# Patient Record
Sex: Female | Born: 1995 | Race: White | Hispanic: Yes | Marital: Single | Smoking: Never smoker
Health system: Southern US, Community
[De-identification: ages and names within clinical notes are randomized; demographics above are authoritative.]

## PROBLEM LIST (undated history)

## (undated) DIAGNOSIS — L309 Dermatitis, unspecified: Secondary | ICD-10-CM

## (undated) DIAGNOSIS — Z8759 Personal history of other complications of pregnancy, childbirth and the puerperium: Secondary | ICD-10-CM

## (undated) HISTORY — PX: WISDOM TOOTH EXTRACTION: SHX21

## (undated) HISTORY — PX: DENTAL EXAMINATION UNDER ANESTHESIA: SHX1447

## (undated) HISTORY — DX: Dermatitis, unspecified: L30.9

## (undated) HISTORY — DX: Personal history of other complications of pregnancy, childbirth and the puerperium: Z87.59

---
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
## 2015-05-28 ENCOUNTER — Ambulatory Visit (INDEPENDENT_AMBULATORY_CARE_PROVIDER_SITE_OTHER): Payer: BLUE CROSS/BLUE SHIELD | Admitting: Family Medicine

## 2015-05-28 ENCOUNTER — Encounter: Payer: Self-pay | Admitting: Family Medicine

## 2015-05-28 VITALS — BP 104/66 | HR 91 | Temp 97.8°F | Ht 61.0 in | Wt 141.2 lb

## 2015-05-28 DIAGNOSIS — Z0000 Encounter for general adult medical examination without abnormal findings: Secondary | ICD-10-CM

## 2015-05-28 NOTE — Progress Notes (Signed)
Pre visit review using our clinic review tool, if applicable. No additional management support is needed unless otherwise documented below in the visit note. 

## 2015-05-28 NOTE — Patient Instructions (Signed)
Good luck in school!

## 2015-05-28 NOTE — Assessment & Plan Note (Signed)
Discussed dangers of smoking, alcohol, and drug abuse.  Also discussed sexual activity, pregnancy risk, and STD risk.  Encouraged to get regular exercise.

## 2015-05-28 NOTE — Progress Notes (Signed)
Subjective:   Patient ID: Tracie Collier, female    DOB: 08-01-1996, 19 y.o.   MRN: [id number]  Tracie Collier is a pleasant 19 y.o. year old female who presents to clinic today with Well Child  on 05/28/2015  HPI:  G0.  Doing well in school at [employer].  Wants to go to law school. She is currently sexually active with one partner- met him at [employer]. Both were tested for STDs.  Also had lab work done at school, per pt.  She is using triamcinolone as needed for eczema.  Current Outpatient Prescriptions on File Prior to Visit  Medication Sig Dispense Refill  . TRIAMCINOLONE ACETONIDE, TOP, 0.05 % OINT Apply to skin twice daily as needed. 17 g 0   No current facility-administered medications on file prior to visit.    Allergies  Allergen Reactions  . Amoxicillin Hives  . Penicillins Hives    No past medical history on file.  No past surgical history on file.  No family history on file.  History   Social History  . Marital Status: Single    Spouse Name: N/A  . Number of Children: N/A  . Years of Education: N/A   Occupational History  . Not on file.   Social History Main Topics  . Smoking status: Never Smoker   . Smokeless tobacco: Never Used  . Alcohol Use: No  . Drug Use: No  . Sexual Activity: Not on file   Other Topics Concern  . Not on file   Social History Narrative  . No narrative on file   The PMH, PSH, Social History, Family History, Medications, and allergies have been reviewed in [facility], and have been updated if relevant.   Review of Systems  Constitutional: Negative.   HENT: Negative.   Eyes: Negative.   Respiratory: Negative.   Cardiovascular: Negative.   Gastrointestinal: Negative.   Endocrine: Negative.   Genitourinary: Negative.   Musculoskeletal: Negative.   Skin: Negative.   Allergic/Immunologic: Negative.   Neurological: Negative.   Psychiatric/Behavioral: Negative.   All other systems reviewed and are negative.      Objective:    BP 104/66 mmHg  Pulse 91  Temp(Src) 97.8 F (36.6 C) (Oral)  Ht '5\' 1"'  (1.549 m)  Wt 141 lb 4 oz (64.071 kg)  BMI 26.70 kg/m2  SpO2 97%  LMP 05/19/2015   Physical Exam    General:  Well-developed,well-nourished,in no acute distress; alert,appropriate and cooperative throughout examination Head:  normocephalic and atraumatic.   Eyes:  vision grossly intact, pupils equal, pupils round, and pupils reactive to light.   Ears:  R ear normal and L ear normal.   Nose:  no external deformity.   Mouth:  good dentition.   Neck:  No deformities, masses, or tenderness noted. Lungs:  Normal respiratory effort, chest expands symmetrically. Lungs are clear to auscultation, no crackles or wheezes. Heart:  Normal rate and regular rhythm. S1 and S2 normal without gallop, murmur, click, rub or other extra sounds. Abdomen:  Bowel sounds positive,abdomen soft and non-tender without masses, organomegaly or hernias noted. Msk:  No deformity or scoliosis noted of thoracic or lumbar spine.   Extremities:  No clubbing, cyanosis, edema, or deformity noted with normal full range of motion of all joints.   Neurologic:  alert & oriented X3 and gait normal.   Skin:  Intact without suspicious lesions or rashes Cervical Nodes:  No lymphadenopathy noted Axillary Nodes:  No palpable lymphadenopathy Psych:  Cognition and judgment appear  intact. Alert and cooperative with normal attention span and concentration. No apparent delusions, illusions, hallucinations      Assessment & Plan:   Well woman exam (no gynecological exam) No Follow-up on file.

## 2015-06-08 ENCOUNTER — Emergency Department (HOSPITAL_COMMUNITY)
Admission: EM | Admit: 2015-06-08 | Discharge: 2015-06-08 | Disposition: A | Discharge status: Home / Self Care | Payer: BLUE CROSS/BLUE SHIELD | Attending: Emergency Medicine | Admitting: Emergency Medicine

## 2015-06-08 ENCOUNTER — Encounter (HOSPITAL_COMMUNITY): Payer: Self-pay

## 2015-06-08 DIAGNOSIS — J029 Acute pharyngitis, unspecified: Secondary | ICD-10-CM

## 2015-06-08 DIAGNOSIS — Z880 Allergy status to penicillin: Secondary | ICD-10-CM | POA: Insufficient documentation

## 2015-06-08 LAB — RAPID STREP SCREEN (MED CTR MEBANE ONLY): Streptococcus, Group A Screen (Direct): NEGATIVE

## 2015-06-08 MED ORDER — LIDOCAINE VISCOUS 2 % MT SOLN: 20.0000 mL | OROMUCOSAL | Status: DC | PRN

## 2015-06-08 NOTE — ED Provider Notes (Signed)
History  This chart was scribed for non-physician practitioner, Santiago GladHeather Luchiano Viscomi, PA-C,working with Lorre NickAnthony Allen, MD, by Karle PlumberJennifer Tensley, ED Scribe. This patient was seen in room TR08C/TR08C and the patient's care was started at 5:41 PM.  Chief Complaint  Patient presents with  . Sore Throat   The history is provided by the patient and medical records. No language interpreter was used.    HPI Comments:  Tracie Collier is a 19 y.o. female who presents to the Emergency Department complaining of severe sore throat that began approximately 13 hours ago. She reports associated cough. She denies any sick contacts. Pt has taken Ibuprofen for pain approximately 3 hours ago with minimal relief of the pain. Swallowing makes the pain worse. She denies alleviating factors. She denies fever, chills, fatigue, HA, inability to swallow, nausea or vomiting.  History reviewed. No pertinent past medical history. History reviewed. No pertinent past surgical history. No family history on file. History  Substance Use Topics  . Smoking status: Never Smoker   . Smokeless tobacco: Never Used  . Alcohol Use: No   OB History    No data available     Review of Systems  Constitutional: Negative for fever, chills and fatigue.  HENT: Positive for sore throat. Negative for trouble swallowing.   Respiratory: Positive for cough.   Gastrointestinal: Negative for nausea and vomiting.  Neurological: Negative for headaches.    Allergies  Amoxicillin and Penicillins  Home Medications   Prior to Admission medications   Medication Sig Start Date End Date Taking? Authorizing Provider  TRIAMCINOLONE ACETONIDE, TOP, 0.05 % OINT Apply to skin twice daily as needed. 09/12/13   Dianne Dunalia M Aron, MD   Triage Vitals: BP 127/72 mmHg  Pulse 98  Temp(Src) 98.3 F (36.8 C) (Oral)  Resp 18  Ht 5\' 1"  (1.549 m)  Wt 140 lb (63.504 kg)  BMI 26.47 kg/m2  SpO2 96%  LMP 05/19/2015 Physical Exam  Constitutional: She is  oriented to person, place, and time. She appears well-developed and well-nourished.  HENT:  Head: Normocephalic and atraumatic.  Right Ear: Tympanic membrane and ear canal normal.  Left Ear: Tympanic membrane and ear canal normal.  Mouth/Throat: Uvula is midline and mucous membranes are normal. No trismus in the jaw. Oropharyngeal exudate present. No posterior oropharyngeal erythema or tonsillar abscesses.  Mild bilateral tonsillar enlargement with cobblestoning present.  Normal voice phonation.  Handling secretions well, no drooling  Eyes: EOM are normal.  Neck: Normal range of motion.  Cardiovascular: Normal rate and regular rhythm.   Pulmonary/Chest: Effort normal and breath sounds normal.  Musculoskeletal: Normal range of motion.  Neurological: She is alert and oriented to person, place, and time.  Skin: Skin is warm and dry.  Psychiatric: She has a normal mood and affect. Her behavior is normal.  Nursing note and vitals reviewed.   ED Course  Procedures (including critical care time) DIAGNOSTIC STUDIES: Oxygen Saturation is 96% on RA, adequate by my interpretation.   COORDINATION OF CARE: 5:46 PM- Informed pt of negative strep test but will send sample for culture. Will prescribe NSAID and viscous lidocaine. Pt verbalizes understanding and agrees to plan.  Medications - No data to display  Labs Review Labs Reviewed  RAPID STREP SCREEN (NOT AT [facility])  CULTURE, GROUP A STREP    Imaging Review No results found.   EKG Interpretation None      MDM   Final diagnoses:  None   Patient presents today with sore throat onset earlier this  morning.  Patient afebrile.  Rapid strep is negative.  No signs of PTA or Retropharyngeal abscess at this time.  Patient stable for discharge.  Return precautions given.  I personally performed the services described in this documentation, which was scribed in my presence. The recorded information has been reviewed and is  accurate.    Santiago Glad, PA-C 06/08/15 1859  Lorre Nick, MD 06/09/15 [phone]

## 2015-06-08 NOTE — ED Notes (Signed)
Declined W/C at D/C and was escorted to lobby by RN. 

## 2015-06-08 NOTE — ED Notes (Addendum)
Pt states her throat started hurting this morning at 0500. Hurts to swallow. Took 400 mg ibuprofen at 1500.

## 2015-06-10 ENCOUNTER — Telehealth: Payer: Self-pay

## 2015-06-10 NOTE — Telephone Encounter (Signed)
PLEASE NOTE: All timestamps contained within this report are represented as [place] Standard Time. CONFIDENTIALTY NOTICE: This fax transmission is intended only for the addressee. It contains information that is legally privileged, confidential or otherwise protected from use or disclosure. If you are not the intended recipient, you are strictly prohibited from reviewing, disclosing, copying using or disseminating any of this information or taking any action in reliance on or regarding this information. If you have received this fax in error, please notify us immediately by telephone so that we can arrange for its return to us. Phone: [phone], Toll-Free: [phone], Fax: [phone] Page: 1 of 2 Call Id: [id number] [facility] Client TELEPHONE ADVICE RECORD [facility] Patient Name: Tracie Collier Cura Gender: Female DOB: 09/12/1996 Age: 19 Y 10 M 8 D Return Phone Number: [phone] (Primary) Address: City/State/Zip: [place] Client [facility] - [facility]  [facility] Physician Ruthe MannanAron, Talia Contact Type Call Call Type Triage / Clinical Relationship To Patient Self Return Phone Number [phone] (Primary) Chief Complaint Sore Throat Initial Comment caller states her tonsils are swollen and white patches on them PreDisposition Home Care Nurse Assessment Nurse: Lequita HaltMorgan, RN, Viviann SpareSteven Date/Time Lamount Cohen(Eastern Time): 06/08/2015 3:26:20 PM Confirm and document reason for call. If symptomatic, describe symptoms. ---Caller states that her tonsils are swollen and states that she can see that her airway is very small and blocking the uvula. Caller states that she woke up at 5am not being to breather very well. Pt states that she has white patches on her tonsils. Has the patient traveled out of the country within the last 30 days? ---No Does the patient require triage? ---Yes Related  visit to physician within the last 2 weeks? ---No Does the PT have any chronic conditions? (i.e. diabetes, asthma, etc.) ---No Did the patient indicate they were pregnant? ---No Guidelines Guideline Title Affirmed Question Affirmed Notes Nurse Date/Time Lamount Cohen(Eastern Time) Sore Throat Difficulty breathing (per caller) but not severe Posey BoyerMorgan, RN, Steven 06/08/2015 3:28:36 PM Disp. Time Lamount Cohen(Eastern Time) Disposition Final User 06/08/2015 2:03:36 PM Send To Clinical Follow Up Queue Alfonse AlpersMcAtee, Shannon 06/08/2015 3:30:30 PM Go to ED Now (or PCP triage) Yes Lequita HaltMorgan, RN, Hilario QuarrySteven Caller Understands: Yes Disagree/Comply: Comply Care Advice Given Per Guideline PLEASE NOTE: All timestamps contained within this report are represented as [place] Standard Time. CONFIDENTIALTY NOTICE: This fax transmission is intended only for the addressee. It contains information that is legally privileged, confidential or otherwise protected from use or disclosure. If you are not the intended recipient, you are strictly prohibited from reviewing, disclosing, copying using or disseminating any of this information or taking any action in reliance on or regarding this information. If you have received this fax in error, please notify us immediately by telephone so that we can arrange for its return to us. Phone: [phone], Toll-Free: [phone], Fax: [phone] Page: 2 of 2 Call Id: [id number] Care Advice Given Per Guideline GO TO ED NOW (OR PCP TRIAGE): CARE ADVICE given per Sore Throat (Pediatric) guideline. After Care Instructions Given Call Event Type User Date / Time Description Referrals [facility]

## 2015-06-10 NOTE — Telephone Encounter (Signed)
Pt seen [place] on 06/08/15.

## 2015-06-11 LAB — CULTURE, GROUP A STREP

## 2015-06-18 ENCOUNTER — Telehealth: Payer: Self-pay | Admitting: Family Medicine

## 2015-06-18 NOTE — Telephone Encounter (Signed)
Pt called checking on test results she had done  On 7/9  Throat culture

## 2015-06-19 NOTE — Telephone Encounter (Signed)
Lm on pts vm requesting a call back 

## 2015-06-19 NOTE — Telephone Encounter (Signed)
It looks like she had throat cx done in ER. Showed bacteria but not strep.  Did they send her home with rx?

## 2015-06-20 NOTE — Telephone Encounter (Signed)
Spoke to pt who states she was given an anti-inflammatory that she was to gargle; unaware of medication name.

## 2015-06-20 NOTE — Telephone Encounter (Signed)
How is she feeling?

## 2015-06-20 NOTE — Telephone Encounter (Signed)
Pt returned your call, please call back at [phone], thanks

## 2015-06-21 NOTE — Telephone Encounter (Signed)
Lm on pts vm requesting a call back 

## 2015-06-27 NOTE — Telephone Encounter (Signed)
Pt left v/m; pt had bacterial culture done at hospital earlier this month; pt has appt at dental office on 06/28/15 and pt wants to know if OK to keep dental appt at 4 pm on 06/28/15 and have dental work done if needed.

## 2015-06-27 NOTE — Telephone Encounter (Signed)
Yes ok to have dental appointment.  How is she feeling?

## 2015-06-28 NOTE — Telephone Encounter (Signed)
Spoke to pt and advised per Dr Dayton Martes. States she is "doing fine now."

## 2016-04-23 ENCOUNTER — Encounter: Payer: Self-pay | Admitting: Family Medicine

## 2016-04-23 ENCOUNTER — Ambulatory Visit (INDEPENDENT_AMBULATORY_CARE_PROVIDER_SITE_OTHER): Payer: BLUE CROSS/BLUE SHIELD | Admitting: Family Medicine

## 2016-04-23 VITALS — BP 102/72 | HR 96 | Temp 98.5°F | Wt 143.2 lb

## 2016-04-23 DIAGNOSIS — M25561 Pain in right knee: Secondary | ICD-10-CM | POA: Diagnosis not present

## 2016-04-23 DIAGNOSIS — M25562 Pain in left knee: Secondary | ICD-10-CM

## 2016-04-23 NOTE — Progress Notes (Signed)
Pre visit review using our clinic review tool, if applicable. No additional management support is needed unless otherwise documented below in the visit note. 

## 2016-04-23 NOTE — Assessment & Plan Note (Signed)
Consistent with patellar femoral syndrome. Given handout from sports medicine advisor with supportive care and exercises. Call or return to clinic prn if these symptoms worsen or fail to improve as anticipated. The patient indicates understanding of these issues and agrees with the plan.

## 2016-04-23 NOTE — Progress Notes (Signed)
   Subjective:   Patient ID: Tracie Collier, female    DOB: 10/05/1996, 20 y.o.   MRN: [id number]  Tracie Collier is a pleasant 20 y.o. year old female who presents to clinic today with Knee Pain  on 04/23/2016  HPI:  Bilateral knee pain- left worse than right. Intermittent for over 6 months. No known injury.  Seems to be worse after sitting for long periods of time or walking a lot.  She hasn't noticed any swelling.  Has not tried any rx for it.  Has not tried any knee braces.  Current Outpatient Prescriptions on File Prior to Visit  Medication Sig Dispense Refill  . TRIAMCINOLONE ACETONIDE, TOP, 0.05 % OINT Apply to skin twice daily as needed. 17 g 0   No current facility-administered medications on file prior to visit.    Allergies  Allergen Reactions  . Amoxicillin Hives  . Penicillins Hives    No past medical history on file.  No past surgical history on file.  No family history on file.  Social History   Social History  . Marital Status: Single    Spouse Name: N/A  . Number of Children: N/A  . Years of Education: N/A   Occupational History  . Not on file.   Social History Main Topics  . Smoking status: Never Smoker   . Smokeless tobacco: Never Used  . Alcohol Use: No  . Drug Use: No  . Sexual Activity: Not on file   Other Topics Concern  . Not on file   Social History Narrative   The PMH, PSH, Social History, Family History, Medications, and allergies have been [facility], and have been updated if relevant.   Review of Systems  Constitutional: Negative.   Cardiovascular: Negative for leg swelling.  Musculoskeletal: Positive for arthralgias. Negative for myalgias, back pain, joint swelling, gait problem, neck pain and neck stiffness.  All other systems reviewed and are negative.      Objective:    BP 102/72 mmHg  Pulse 96  Temp(Src) 98.5 F (36.9 C) (Oral)  Wt 143 lb 4 oz (64.978 kg)  SpO2 98%  LMP 03/22/2016   Physical Exam   Constitutional: She is oriented to person, place, and time. She appears well-developed and well-nourished. No distress.  Eyes: Conjunctivae are normal.  Cardiovascular: Normal rate.   Pulmonary/Chest: Effort normal.  Musculoskeletal:  Bilateral knee- no crepitus, no effusion, FROM  Neurological: She is alert and oriented to person, place, and time. No cranial nerve deficit.  Skin: Skin is warm and dry. She is not diaphoretic.  Psychiatric: She has a normal mood and affect. Her behavior is normal. Judgment and thought content normal.  Nursing note and vitals reviewed.         Assessment & Plan:   Bilateral knee pain No Follow-up on file.

## 2019-04-13 ENCOUNTER — Telehealth: Payer: Self-pay | Admitting: Family Medicine

## 2019-04-13 NOTE — Telephone Encounter (Signed)
Called and could not leave a vm. Calling to verify PCP. Patient has not been seen since 2017 °

## 2019-05-17 ENCOUNTER — Ambulatory Visit (INDEPENDENT_AMBULATORY_CARE_PROVIDER_SITE_OTHER): Payer: BC Managed Care – PPO | Admitting: Psychology

## 2019-05-17 DIAGNOSIS — F410 Panic disorder [episodic paroxysmal anxiety] without agoraphobia: Secondary | ICD-10-CM | POA: Diagnosis not present

## 2019-05-31 ENCOUNTER — Ambulatory Visit (INDEPENDENT_AMBULATORY_CARE_PROVIDER_SITE_OTHER): Payer: BC Managed Care – PPO | Admitting: Psychology

## 2019-05-31 DIAGNOSIS — F410 Panic disorder [episodic paroxysmal anxiety] without agoraphobia: Secondary | ICD-10-CM | POA: Diagnosis not present

## 2019-06-14 ENCOUNTER — Ambulatory Visit (INDEPENDENT_AMBULATORY_CARE_PROVIDER_SITE_OTHER): Payer: BC Managed Care – PPO | Admitting: Psychology

## 2019-06-14 DIAGNOSIS — F410 Panic disorder [episodic paroxysmal anxiety] without agoraphobia: Secondary | ICD-10-CM

## 2019-06-29 ENCOUNTER — Ambulatory Visit: Payer: BC Managed Care – PPO | Admitting: Psychology

## 2019-07-05 ENCOUNTER — Ambulatory Visit (INDEPENDENT_AMBULATORY_CARE_PROVIDER_SITE_OTHER): Payer: BC Managed Care – PPO | Admitting: Psychology

## 2019-07-05 DIAGNOSIS — F410 Panic disorder [episodic paroxysmal anxiety] without agoraphobia: Secondary | ICD-10-CM

## 2019-07-26 ENCOUNTER — Ambulatory Visit (INDEPENDENT_AMBULATORY_CARE_PROVIDER_SITE_OTHER): Payer: BC Managed Care – PPO | Admitting: Psychology

## 2019-07-26 DIAGNOSIS — F410 Panic disorder [episodic paroxysmal anxiety] without agoraphobia: Secondary | ICD-10-CM | POA: Diagnosis not present

## 2019-08-09 ENCOUNTER — Ambulatory Visit (INDEPENDENT_AMBULATORY_CARE_PROVIDER_SITE_OTHER): Payer: BC Managed Care – PPO | Admitting: Psychology

## 2019-08-09 DIAGNOSIS — F410 Panic disorder [episodic paroxysmal anxiety] without agoraphobia: Secondary | ICD-10-CM | POA: Diagnosis not present

## 2019-08-23 ENCOUNTER — Ambulatory Visit (INDEPENDENT_AMBULATORY_CARE_PROVIDER_SITE_OTHER): Payer: BC Managed Care – PPO | Admitting: Psychology

## 2019-08-23 DIAGNOSIS — F411 Generalized anxiety disorder: Secondary | ICD-10-CM | POA: Diagnosis not present

## 2019-08-30 ENCOUNTER — Ambulatory Visit: Payer: BC Managed Care – PPO | Admitting: Psychology

## 2019-09-06 ENCOUNTER — Ambulatory Visit (INDEPENDENT_AMBULATORY_CARE_PROVIDER_SITE_OTHER): Payer: BC Managed Care – PPO | Admitting: Psychology

## 2019-09-06 DIAGNOSIS — F410 Panic disorder [episodic paroxysmal anxiety] without agoraphobia: Secondary | ICD-10-CM

## 2019-09-20 ENCOUNTER — Ambulatory Visit: Payer: BC Managed Care – PPO | Admitting: Psychology

## 2019-09-21 ENCOUNTER — Ambulatory Visit (INDEPENDENT_AMBULATORY_CARE_PROVIDER_SITE_OTHER): Payer: BC Managed Care – PPO | Admitting: Psychology

## 2019-09-21 DIAGNOSIS — F410 Panic disorder [episodic paroxysmal anxiety] without agoraphobia: Secondary | ICD-10-CM

## 2019-10-02 ENCOUNTER — Ambulatory Visit (INDEPENDENT_AMBULATORY_CARE_PROVIDER_SITE_OTHER): Payer: BC Managed Care – PPO | Admitting: Psychology

## 2019-10-02 DIAGNOSIS — F410 Panic disorder [episodic paroxysmal anxiety] without agoraphobia: Secondary | ICD-10-CM | POA: Diagnosis not present

## 2019-10-16 ENCOUNTER — Ambulatory Visit: Payer: BC Managed Care – PPO | Admitting: Psychology

## 2019-12-05 ENCOUNTER — Ambulatory Visit (INDEPENDENT_AMBULATORY_CARE_PROVIDER_SITE_OTHER): Payer: BC Managed Care – PPO | Admitting: Psychology

## 2019-12-05 DIAGNOSIS — F410 Panic disorder [episodic paroxysmal anxiety] without agoraphobia: Secondary | ICD-10-CM

## 2019-12-22 ENCOUNTER — Ambulatory Visit (INDEPENDENT_AMBULATORY_CARE_PROVIDER_SITE_OTHER): Payer: BC Managed Care – PPO | Admitting: Psychology

## 2019-12-22 DIAGNOSIS — F410 Panic disorder [episodic paroxysmal anxiety] without agoraphobia: Secondary | ICD-10-CM

## 2020-01-02 ENCOUNTER — Ambulatory Visit: Payer: BC Managed Care – PPO | Admitting: Psychology

## 2020-01-16 ENCOUNTER — Ambulatory Visit: Payer: BC Managed Care – PPO | Admitting: Psychology

## 2020-01-29 ENCOUNTER — Ambulatory Visit (INDEPENDENT_AMBULATORY_CARE_PROVIDER_SITE_OTHER): Payer: BC Managed Care – PPO | Admitting: Psychology

## 2020-01-29 DIAGNOSIS — F410 Panic disorder [episodic paroxysmal anxiety] without agoraphobia: Secondary | ICD-10-CM | POA: Diagnosis not present

## 2020-02-12 ENCOUNTER — Ambulatory Visit (INDEPENDENT_AMBULATORY_CARE_PROVIDER_SITE_OTHER): Payer: BC Managed Care – PPO | Admitting: Psychology

## 2020-02-12 DIAGNOSIS — F410 Panic disorder [episodic paroxysmal anxiety] without agoraphobia: Secondary | ICD-10-CM | POA: Diagnosis not present

## 2020-02-14 LAB — OB RESULTS CONSOLE GC/CHLAMYDIA: Chlamydia: NEGATIVE

## 2020-02-26 ENCOUNTER — Ambulatory Visit (INDEPENDENT_AMBULATORY_CARE_PROVIDER_SITE_OTHER): Payer: BC Managed Care – PPO | Admitting: Psychology

## 2020-02-26 DIAGNOSIS — F410 Panic disorder [episodic paroxysmal anxiety] without agoraphobia: Secondary | ICD-10-CM

## 2020-03-11 ENCOUNTER — Ambulatory Visit (INDEPENDENT_AMBULATORY_CARE_PROVIDER_SITE_OTHER): Payer: BC Managed Care – PPO | Admitting: Psychology

## 2020-03-11 DIAGNOSIS — F410 Panic disorder [episodic paroxysmal anxiety] without agoraphobia: Secondary | ICD-10-CM

## 2020-03-25 ENCOUNTER — Ambulatory Visit: Payer: BC Managed Care – PPO | Admitting: Psychology

## 2020-05-17 DIAGNOSIS — Z3A16 16 weeks gestation of pregnancy: Secondary | ICD-10-CM | POA: Diagnosis not present

## 2020-05-17 DIAGNOSIS — O3482 Maternal care for other abnormalities of pelvic organs, second trimester: Secondary | ICD-10-CM | POA: Diagnosis not present

## 2020-05-17 DIAGNOSIS — O0992 Supervision of high risk pregnancy, unspecified, second trimester: Secondary | ICD-10-CM | POA: Diagnosis not present

## 2020-05-22 ENCOUNTER — Telehealth: Payer: Self-pay | Admitting: General Practice

## 2020-05-22 NOTE — Telephone Encounter (Signed)
Removed Dr.Aron as patient's PCP due to her leaving this practice and patient has not been seen in 4 years.

## 2020-06-11 DIAGNOSIS — O3482 Maternal care for other abnormalities of pelvic organs, second trimester: Secondary | ICD-10-CM | POA: Diagnosis not present

## 2020-06-11 DIAGNOSIS — Z3A20 20 weeks gestation of pregnancy: Secondary | ICD-10-CM | POA: Diagnosis not present

## 2020-07-23 DIAGNOSIS — Z369 Encounter for antenatal screening, unspecified: Secondary | ICD-10-CM | POA: Diagnosis not present

## 2020-07-23 LAB — HM HIV SCREENING LAB: HM HIV Screening: NEGATIVE

## 2020-08-06 DIAGNOSIS — Z23 Encounter for immunization: Secondary | ICD-10-CM | POA: Diagnosis not present

## 2020-08-20 DIAGNOSIS — Z3A30 30 weeks gestation of pregnancy: Secondary | ICD-10-CM | POA: Diagnosis not present

## 2020-08-20 DIAGNOSIS — O0993 Supervision of high risk pregnancy, unspecified, third trimester: Secondary | ICD-10-CM | POA: Diagnosis not present

## 2020-08-20 DIAGNOSIS — D270 Benign neoplasm of right ovary: Secondary | ICD-10-CM | POA: Diagnosis not present

## 2020-08-23 DIAGNOSIS — Z041 Encounter for examination and observation following transport accident: Secondary | ICD-10-CM | POA: Diagnosis not present

## 2020-08-23 DIAGNOSIS — Z3A30 30 weeks gestation of pregnancy: Secondary | ICD-10-CM | POA: Diagnosis not present

## 2020-08-23 DIAGNOSIS — D270 Benign neoplasm of right ovary: Secondary | ICD-10-CM | POA: Diagnosis not present

## 2020-08-23 DIAGNOSIS — D271 Benign neoplasm of left ovary: Secondary | ICD-10-CM | POA: Diagnosis not present

## 2020-08-23 DIAGNOSIS — Z880 Allergy status to penicillin: Secondary | ICD-10-CM | POA: Diagnosis not present

## 2020-08-23 DIAGNOSIS — Z881 Allergy status to other antibiotic agents status: Secondary | ICD-10-CM | POA: Diagnosis not present

## 2020-08-23 DIAGNOSIS — O99891 Other specified diseases and conditions complicating pregnancy: Secondary | ICD-10-CM | POA: Diagnosis not present

## 2020-08-23 DIAGNOSIS — Z3483 Encounter for supervision of other normal pregnancy, third trimester: Secondary | ICD-10-CM | POA: Diagnosis not present

## 2020-09-24 DIAGNOSIS — Z3A35 35 weeks gestation of pregnancy: Secondary | ICD-10-CM | POA: Diagnosis not present

## 2020-09-24 DIAGNOSIS — O3483 Maternal care for other abnormalities of pelvic organs, third trimester: Secondary | ICD-10-CM | POA: Diagnosis not present

## 2020-09-24 DIAGNOSIS — O0993 Supervision of high risk pregnancy, unspecified, third trimester: Secondary | ICD-10-CM | POA: Diagnosis not present

## 2020-10-01 DIAGNOSIS — Z369 Encounter for antenatal screening, unspecified: Secondary | ICD-10-CM | POA: Diagnosis not present

## 2020-10-15 DIAGNOSIS — O2603 Excessive weight gain in pregnancy, third trimester: Secondary | ICD-10-CM | POA: Diagnosis not present

## 2020-10-15 DIAGNOSIS — O26843 Uterine size-date discrepancy, third trimester: Secondary | ICD-10-CM | POA: Diagnosis not present

## 2020-10-15 DIAGNOSIS — Z3A38 38 weeks gestation of pregnancy: Secondary | ICD-10-CM | POA: Diagnosis not present

## 2020-10-18 DIAGNOSIS — D270 Benign neoplasm of right ovary: Secondary | ICD-10-CM | POA: Diagnosis not present

## 2020-10-18 DIAGNOSIS — O1414 Severe pre-eclampsia complicating childbirth: Secondary | ICD-10-CM | POA: Diagnosis not present

## 2020-10-18 DIAGNOSIS — O1413 Severe pre-eclampsia, third trimester: Secondary | ICD-10-CM | POA: Diagnosis not present

## 2020-10-18 DIAGNOSIS — O3483 Maternal care for other abnormalities of pelvic organs, third trimester: Secondary | ICD-10-CM | POA: Diagnosis not present

## 2020-10-18 DIAGNOSIS — Z3A00 Weeks of gestation of pregnancy not specified: Secondary | ICD-10-CM | POA: Diagnosis not present

## 2020-10-18 DIAGNOSIS — O163 Unspecified maternal hypertension, third trimester: Secondary | ICD-10-CM | POA: Diagnosis not present

## 2020-10-18 DIAGNOSIS — Z3A38 38 weeks gestation of pregnancy: Secondary | ICD-10-CM | POA: Diagnosis not present

## 2020-10-18 DIAGNOSIS — O2603 Excessive weight gain in pregnancy, third trimester: Secondary | ICD-10-CM | POA: Diagnosis not present

## 2020-10-18 DIAGNOSIS — O365930 Maternal care for other known or suspected poor fetal growth, third trimester, not applicable or unspecified: Secondary | ICD-10-CM | POA: Diagnosis not present

## 2020-10-18 DIAGNOSIS — O1213 Gestational proteinuria, third trimester: Secondary | ICD-10-CM | POA: Diagnosis not present

## 2022-04-14 ENCOUNTER — Encounter: Payer: Self-pay | Admitting: Specialist

## 2022-04-14 ENCOUNTER — Ambulatory Visit (INDEPENDENT_AMBULATORY_CARE_PROVIDER_SITE_OTHER): Payer: BC Managed Care – PPO | Admitting: Nurse Practitioner

## 2022-04-14 ENCOUNTER — Encounter: Payer: Self-pay | Admitting: Nurse Practitioner

## 2022-04-14 VITALS — BP 114/82 | HR 79 | Temp 97.3°F | Resp 14 | Ht 61.75 in | Wt 155.4 lb

## 2022-04-14 DIAGNOSIS — L309 Dermatitis, unspecified: Secondary | ICD-10-CM | POA: Diagnosis not present

## 2022-04-14 DIAGNOSIS — Z0000 Encounter for general adult medical examination without abnormal findings: Secondary | ICD-10-CM | POA: Diagnosis not present

## 2022-04-14 DIAGNOSIS — R5383 Other fatigue: Secondary | ICD-10-CM

## 2022-04-14 DIAGNOSIS — Z0001 Encounter for general adult medical examination with abnormal findings: Secondary | ICD-10-CM | POA: Diagnosis not present

## 2022-04-14 DIAGNOSIS — E663 Overweight: Secondary | ICD-10-CM | POA: Insufficient documentation

## 2022-04-14 LAB — CBC WITH DIFFERENTIAL/PLATELET
Basophils Absolute: 0 10*3/uL (ref 0.0–0.1)
Basophils Relative: 0.5 % (ref 0.0–3.0)
Eosinophils Absolute: 0 10*3/uL (ref 0.0–0.7)
Eosinophils Relative: 0.9 % (ref 0.0–5.0)
HCT: 42.1 % (ref 36.0–46.0)
Hemoglobin: 13.9 g/dL (ref 12.0–15.0)
Lymphocytes Relative: 38.1 % (ref 12.0–46.0)
Lymphs Abs: 2.1 10*3/uL (ref 0.7–4.0)
MCHC: 33.1 g/dL (ref 30.0–36.0)
MCV: 91.1 fl (ref 78.0–100.0)
Monocytes Absolute: 0.4 10*3/uL (ref 0.1–1.0)
Monocytes Relative: 6.6 % (ref 3.0–12.0)
Neutro Abs: 3 10*3/uL (ref 1.4–7.7)
Neutrophils Relative %: 53.9 % (ref 43.0–77.0)
Platelets: 226 10*3/uL (ref 150.0–400.0)
RBC: 4.62 Mil/uL (ref 3.87–5.11)
RDW: 13 % (ref 11.5–15.5)
WBC: 5.5 10*3/uL (ref 4.0–10.5)

## 2022-04-14 LAB — LIPID PANEL
Cholesterol: 186 mg/dL (ref 0–200)
HDL: 45 mg/dL (ref >39.00)
LDL Cholesterol: 117 mg/dL — ABNORMAL HIGH (ref 0–99)
NonHDL: 141.18
Total CHOL/HDL Ratio: 4
Triglycerides: 123 mg/dL (ref 0.0–149.0)
VLDL: 24.6 mg/dL (ref 0.0–40.0)

## 2022-04-14 LAB — COMPREHENSIVE METABOLIC PANEL
ALT: 18 U/L (ref 0–35)
AST: 15 U/L (ref 0–37)
Albumin: 4.3 g/dL (ref 3.5–5.2)
Alkaline Phosphatase: 61 U/L (ref 39–117)
BUN: 11 mg/dL (ref 6–23)
CO2: 25 mEq/L (ref 19–32)
Calcium: 9.4 mg/dL (ref 8.4–10.5)
Chloride: 104 mEq/L (ref 96–112)
Creatinine, Ser: 0.82 mg/dL (ref 0.40–1.20)
GFR: 99.22 mL/min (ref >60.00)
Glucose, Bld: 85 mg/dL (ref 70–99)
Potassium: 4.2 mEq/L (ref 3.5–5.1)
Sodium: 140 mEq/L (ref 135–145)
Total Bilirubin: 0.7 mg/dL (ref 0.2–1.2)
Total Protein: 6.9 g/dL (ref 6.0–8.3)

## 2022-04-14 LAB — VITAMIN B12: Vitamin B-12: 162 pg/mL — ABNORMAL LOW (ref 211–911)

## 2022-04-14 LAB — TSH: TSH: 1.25 u[IU]/mL (ref 0.35–5.50)

## 2022-04-14 LAB — HEMOGLOBIN A1C: Hgb A1c MFr Bld: 5.3 % (ref 4.6–6.5)

## 2022-04-14 MED ORDER — TRIAMCINOLONE ACETONIDE 0.1 % EX CREA: 1.0000 "application " | TOPICAL_CREAM | Freq: Two times a day (BID) | CUTANEOUS | 0 refills | Status: AC

## 2022-04-14 NOTE — Assessment & Plan Note (Signed)
Does have eczema that waxes and wanes.  She has used the triamcinolone 0.1% worked well in the past.  We will send that in and she did have a large lesion on her back.  Did inform patient that this is not for widespread use on the body as she has large lesion she needs to be seen in office prior to using large amounts of topical steroid.  Did review precautions in regards to his topical steroid use ?

## 2022-04-14 NOTE — Assessment & Plan Note (Signed)
Continue working on healthy lifestyle modifications 

## 2022-04-14 NOTE — Patient Instructions (Signed)
Nice to see you today ?I will be in touch with lab results ?Follow up with me in 1 year, sooner if you need me ?

## 2022-04-14 NOTE — Assessment & Plan Note (Signed)
Could be multifactorial.  Patient does have some anxiety that can inhibit her from going to bed and appropriate time.  Did offer medications with patient deferred did offer therapy she will think about it.  Pending lab results ?

## 2022-04-14 NOTE — Assessment & Plan Note (Signed)
Discussed age-appropriate immunizations and screening exams. 

## 2022-04-14 NOTE — Progress Notes (Signed)
? ?Established Patient Office Visit ? ?Subjective   ?Patient ID: Tracie Collier, female    DOB: 07/12/1996  Age: 26 y.o. MRN: [id number] ? ?Chief Complaint  ?Patient presents with  ? Establish Care  ?  Had GYN with [facility].  ? skin issues  ?  Has eczema, gets a red break out on her back off and on. Has been prescribed with fungual treatment, triamcinolone .05% and she tried 1% of her MIL and her break out went away and would like to get RX. This happens with excessive sweating.  ? Fatigue  ?  Has noticed issues with this in the last about 2 months, feeling drained, mom has history of iron and potassium deficiency.  ? ? ?HPI ? ?Skin issue: States that she has had it for the majority of her life. States that traditionally it was in the bend of her arms. In 2017 she had a large spot on her back that did not. Worse with heat and moisture  ? ?Fatigue: states that she is unsure of how lon. States that she is having adeaquate food intake. States that she will feel tired around 4-6pm in the day. Not every day use to not drink caffiene. States that headache is not everyday. Nausea with it no other symptoms ? ?Sleep: States she will go lay down aroun 1030-11 [place] Dec that she will think about all kinds of things when she is trying to go to sleep. Gets gets up around 930am. States that she has done therapy in the past when she was pregnant ? ?LMP: 03/21/2022 ? ?for complete physical and follow up of chronic conditions. ? ?Immunizations: ?-Tetanus:2011 up to date with pregnanct ?-Influenza: Out of season ?-Covid-19: ?-Shingles: Too young ?-Pneumonia: ? ? ?-HPV: ? ?Diet: Fair diet.  ?Exercise: No regular exercise. Walking more. 30 mins at a time once a week ? ?Eye exam: Completes annually. Has appointment   ?Dental exam: Completes semi-annually  ? ?Pap Smear: Completed in march of 2021 ?Mammogram: Too young ?Colonoscopy: Too young ?Dexa: Too young ? ? ?Lung Cancer Screening:  ? ? ?  04/14/2022  ? 11:09 AM  ?GAD 7 :  Generalized Anxiety Score  ?Nervous, Anxious, on Edge 2  ?Control/stop worrying 2  ?Worry too much - different things 2  ?Trouble relaxing 1  ?Restless 1  ?Easily annoyed or irritable 2  ?Afraid - awful might happen 0  ?Total GAD 7 Score 10  ?Anxiety Difficulty Somewhat difficult  ? ?  ? ? ? ?Review of Systems  ?Constitutional:  Positive for malaise/fatigue. Negative for chills and fever.  ?Respiratory:  Negative for shortness of breath.   ?Cardiovascular:  Negative for chest pain.  ?Gastrointestinal:  Negative for blood in stool, constipation, diarrhea, nausea and vomiting.  ?     BM daily ?  ?Genitourinary:  Negative for dysuria and hematuria.  ?Neurological:  Positive for headaches. Negative for weakness.  ?Psychiatric/Behavioral:  Negative for hallucinations and suicidal ideas.   ? ?  ?Objective:  ?  ? ?BP 114/82   Pulse 79   Temp (!) 97.3 ?F (36.3 ?C)   Resp 14   Ht 5' 1.75" (1.568 m)   Wt 155 lb 7 oz (70.5 kg)   LMP 03/21/2022   SpO2 97%   BMI 28.66 kg/m?  ? ? ?Physical Exam ?Vitals and nursing note reviewed.  ?Constitutional:   ?   Appearance: Normal appearance.  ?HENT:  ?   Right Ear: Tympanic membrane, ear canal and external ear normal.  ?  Left Ear: Tympanic membrane, ear canal and external ear normal.  ?Cardiovascular:  ?   Rate and Rhythm: Normal rate and regular rhythm.  ?   Pulses: Normal pulses.  ?   Heart sounds: Normal heart sounds.  ?Pulmonary:  ?   Effort: Pulmonary effort is normal.  ?   Breath sounds: Normal breath sounds.  ?Abdominal:  ?   General: Bowel sounds are normal. There is no distension.  ?   Palpations: There is no mass.  ?   Tenderness: There is no abdominal tenderness.  ?   Hernia: No hernia is present.  ?Musculoskeletal:  ?   Right lower leg: No edema.  ?Lymphadenopathy:  ?   Cervical: No cervical adenopathy.  ?Skin: ?   General: Skin is warm.  ?   Findings: Rash present.  ?Neurological:  ?   General: No focal deficit present.  ?   Mental Status: She is alert.  ?    Comments: Bilateral upper and lower extremity strength 5/5  ?Psychiatric:     ?   Mood and Affect: Mood normal.     ?   Behavior: Behavior normal.     ?   Thought Content: Thought content normal.     ?   Judgment: Judgment normal.  ? ? ? ?Results for orders placed or performed in visit on 04/14/22  ?OB RESULTS CONSOLE GC/Chlamydia  ?Result Value Ref Range  ? Chlamydia Negative   ?HM HIV SCREENING LAB  ?Result Value Ref Range  ? HM HIV Screening Negative - Validated   ?HM PAP SMEAR  ?Result Value Ref Range  ? HM Pap smear negative-in care everywhere-no HPV   ? ? ? ? ?The ASCVD Risk score (Arnett DK, et al., 2019) failed to calculate for the following reasons: ?  The 2019 ASCVD risk score is only valid for ages 37 to 106 ? ?  ?Assessment & Plan:  ? ?Problem List Items Addressed This Visit   ? ?  ? Musculoskeletal and Integument  ? Eczema  ?  Does have eczema that waxes and wanes.  She has used the triamcinolone 0.1% worked well in the past.  We will send that in and she did have a large lesion on her back.  Did inform patient that this is not for widespread use on the body as she has large lesion she needs to be seen in office prior to using large amounts of topical steroid.  Did review precautions in regards to his topical steroid use ? ?  ?  ? Relevant Medications  ? triamcinolone cream (KENALOG) 0.1 %  ?  ? Other  ? Preventative health care - Primary  ?  Discussed age-appropriate immunizations and screening exams. ? ?  ?  ? Relevant Orders  ? Comprehensive metabolic panel  ? CBC with Differential/Platelet  ? Lipid panel  ? Hemoglobin A1c  ? Other fatigue  ?  Could be multifactorial.  Patient does have some anxiety that can inhibit her from going to bed and appropriate time.  Did offer medications with patient deferred did offer therapy she will think about it.  Pending lab results ? ?  ?  ? Relevant Orders  ? TSH  ? Vitamin B12  ? Overweight  ?  Continue working on healthy lifestyle modifications ? ?  ?  ? Relevant  Orders  ? Lipid panel  ? Hemoglobin A1c  ? ? ?No follow-ups on file.  ? ? ?Audria Nine, NP ? ?

## 2022-04-15 ENCOUNTER — Other Ambulatory Visit: Payer: Self-pay | Admitting: Nurse Practitioner

## 2022-04-15 DIAGNOSIS — E538 Deficiency of other specified B group vitamins: Secondary | ICD-10-CM

## 2022-04-16 ENCOUNTER — Telehealth: Payer: Self-pay | Admitting: Nurse Practitioner

## 2022-04-16 NOTE — Telephone Encounter (Signed)
-----   Message from [facility], [place] sent at 04/15/2022  3:46 PM EDT ----- Patient advised. Lab appointment made for 05/18/22. 1)Patient would like to know what caused the level to be low? She did have a baby 2 years ago if that matters. I went over diet eating dairy, fish, poultry as per google and patient states her diet has not changed and she has been eating more of those things. Anything else she needs to do in addition to OTC treatment. 2) also, is this something she will always have an issue with? Or is it going to be like if the levels are better on the recheck she stops, what type of follow up would look like for this.

## 2022-04-16 NOTE — Telephone Encounter (Signed)
Not sure what caused this. It could be [place] just treating it and then she never have to worry about it. Sometimes people do not have the ability to absorb B12 and we may have to supplement for life. It is too soon to tell. Just the recheck currently is the only follow up until we get that lab back

## 2022-04-16 NOTE — Telephone Encounter (Signed)
Patient advised.

## 2022-05-18 ENCOUNTER — Other Ambulatory Visit (INDEPENDENT_AMBULATORY_CARE_PROVIDER_SITE_OTHER): Payer: BC Managed Care – PPO

## 2022-05-18 DIAGNOSIS — E538 Deficiency of other specified B group vitamins: Secondary | ICD-10-CM | POA: Diagnosis not present

## 2022-05-18 LAB — VITAMIN B12: Vitamin B-12: 195 pg/mL — ABNORMAL LOW (ref 211–911)

## 2023-05-28 ENCOUNTER — Ambulatory Visit: Payer: Medicaid Other | Admitting: Nurse Practitioner

## 2023-05-28 ENCOUNTER — Encounter: Payer: Self-pay | Admitting: Nurse Practitioner

## 2023-05-28 VITALS — BP 100/70 | HR 80 | Temp 98.1°F | Resp 16 | Ht 61.75 in | Wt 155.5 lb

## 2023-05-28 DIAGNOSIS — N6452 Nipple discharge: Secondary | ICD-10-CM

## 2023-05-28 DIAGNOSIS — Z3200 Encounter for pregnancy test, result unknown: Secondary | ICD-10-CM | POA: Insufficient documentation

## 2023-05-28 LAB — POCT URINE PREGNANCY: Preg Test, Ur: NEGATIVE

## 2023-05-28 LAB — HCG, QUANTITATIVE, PREGNANCY: Quantitative HCG: <0.6 m[IU]/mL

## 2023-05-28 NOTE — Progress Notes (Signed)
Acute Office Visit  Subjective:     Patient ID: Tracie Collier, female    DOB: 04-01-1996, 27 y.o.   MRN: [id number]  Chief Complaint  Patient presents with   Breast Problem    Started yesterday pt took a pregnancy test came back +     Patient is in today for possible pregnancy  States that she started lactating yesterday and states that she took a pregnancy test. States that it was positive. LMP 05/09/2023 that was a little heavy the period before was super light.  Right sided nipple discharge. States that she was able to squeeze her breast and get some "milk". States that the first time it was enough to wet her shirt. States not lumps or nodlues. States that both breast have been tender but was associated with her period and that normally happens per her report   Review of Systems  Constitutional:  Negative for chills and fever.  Respiratory:  Negative for shortness of breath.   Cardiovascular:  Negative for chest pain.  Gastrointestinal:  Positive for nausea. Negative for abdominal pain, constipation, diarrhea and vomiting.  Genitourinary:  Negative for dysuria and hematuria.  Skin:        "+" right nipple discharge         Objective:    BP 100/70   Pulse 80   Temp 98.1 F (36.7 C)   Resp 16   Ht 5' 1.75" (1.568 m)   Wt 155 lb 8 oz (70.5 kg)   LMP 05/09/2023   SpO2 98%   BMI 28.67 kg/m  BP Readings from Last 3 Encounters:  05/28/23 100/70  04/14/22 114/82  04/23/16 102/72   Wt Readings from Last 3 Encounters:  05/28/23 155 lb 8 oz (70.5 kg)  04/14/22 155 lb 7 oz (70.5 kg)  04/23/16 143 lb 4 oz (65 kg) (73 %, Z= 0.62)*   * Growth percentiles are based on CDC (Girls, 2-20 Years) data.      Physical Exam Vitals and nursing note reviewed.  Constitutional:      Appearance: Normal appearance.  Cardiovascular:     Rate and Rhythm: Normal rate and regular rhythm.     Heart sounds: Normal heart sounds.  Pulmonary:     Effort: Pulmonary effort is normal.      Breath sounds: Normal breath sounds.  Abdominal:     General: Bowel sounds are normal. There is no distension.     Palpations: There is no mass.     Tenderness: There is no abdominal tenderness.     Hernia: No hernia is present.  Neurological:     Mental Status: She is alert.     Results for orders placed or performed in visit on 05/28/23  POCT urine pregnancy  Result Value Ref Range   Preg Test, Ur Negative Negative        Assessment & Plan:   Problem List Items Addressed This Visit       Other   Possible pregnancy, not confirmed - Primary    Patient did a home pregnancy test after having right-sided nipple discharge that came back positive this was digital per patient report urine pregnancy test was negative in office today we will send out hCG quantitative to verify.  Patient recently had her period approximately 2 weeks ago.  Is having unprotected sex last unprotected sex was this past Sunday.  Patient is not on any type of birth control      Relevant Orders  POCT urine pregnancy (Completed)   hCG, quantitative, pregnancy   Nipple discharge    Likely incidental.  Defer breast exam at this juncture.  Check hCG G quantitative.  If this happens she has follow-up and we will do a breast exam        No orders of the defined types were placed in this encounter.   Return in about 2 months (around 07/28/2023) for CPE and Labs.  Audria Nine, NP

## 2023-05-28 NOTE — Assessment & Plan Note (Signed)
Patient did a home pregnancy test after having right-sided nipple discharge that came back positive this was digital per patient report urine pregnancy test was negative in office today we will send out hCG quantitative to verify.  Patient recently had her period approximately 2 weeks ago.  Is having unprotected sex last unprotected sex was this past Sunday.  Patient is not on any type of birth control

## 2023-05-28 NOTE — Patient Instructions (Signed)
Nice to see you today The urine pregnancy test came back negative in office I will send off a blood test to verify Follow up in 1-2 months for you physical and full panel of labs

## 2023-05-28 NOTE — Assessment & Plan Note (Signed)
Likely incidental.  Defer breast exam at this juncture.  Check hCG G quantitative.  If this happens she has follow-up and we will do a breast exam

## 2024-03-07 ENCOUNTER — Telehealth: Payer: Self-pay

## 2024-03-07 NOTE — Telephone Encounter (Signed)
 Looks like she is already scheduled in May with Modesto Charon. Will send to [employer] cable for approval.

## 2024-03-07 NOTE — Telephone Encounter (Signed)
 Copied from CRM [id number]. Topic: Appointments - Transfer of Care >> Mar 07, 2024 11:38 AM [place] E wrote: Pt is requesting to transfer FROM: Dr. Mordecai Maes Pt is requesting to transfer TO: Modesto Charon, NP Reason for requested transfer: Patient was wanting a female provider. It is the responsibility of the team the patient would like to transfer to Modesto Charon, NP) to reach out to the patient if for any reason this transfer is not acceptable.

## 2024-03-07 NOTE — Telephone Encounter (Signed)
 Ok by me

## 2024-04-26 ENCOUNTER — Encounter: Payer: Self-pay | Admitting: General Practice

## 2024-04-26 ENCOUNTER — Ambulatory Visit: Admitting: General Practice

## 2024-04-26 VITALS — BP 120/78 | HR 95 | Temp 98.3°F | Ht 61.5 in | Wt 154.0 lb

## 2024-04-26 DIAGNOSIS — R202 Paresthesia of skin: Secondary | ICD-10-CM

## 2024-04-26 DIAGNOSIS — R200 Anesthesia of skin: Secondary | ICD-10-CM | POA: Insufficient documentation

## 2024-04-26 DIAGNOSIS — Z1159 Encounter for screening for other viral diseases: Secondary | ICD-10-CM

## 2024-04-26 DIAGNOSIS — Z7689 Persons encountering health services in other specified circumstances: Secondary | ICD-10-CM | POA: Insufficient documentation

## 2024-04-26 LAB — LIPID PANEL
Cholesterol: 217 mg/dL — ABNORMAL HIGH (ref 0–200)
HDL: 52.6 mg/dL (ref >39.00)
LDL Cholesterol: 138 mg/dL — ABNORMAL HIGH (ref 0–99)
NonHDL: 164.24
Total CHOL/HDL Ratio: 4
Triglycerides: 130 mg/dL (ref 0.0–149.0)
VLDL: 26 mg/dL (ref 0.0–40.0)

## 2024-04-26 LAB — HEMOGLOBIN A1C: Hgb A1c MFr Bld: 5.3 % (ref 4.6–6.5)

## 2024-04-26 LAB — COMPREHENSIVE METABOLIC PANEL WITH GFR
ALT: 12 U/L (ref 0–35)
AST: 13 U/L (ref 0–37)
Albumin: 4.2 g/dL (ref 3.5–5.2)
Alkaline Phosphatase: 63 U/L (ref 39–117)
BUN: 14 mg/dL (ref 6–23)
CO2: 28 meq/L (ref 19–32)
Calcium: 9.2 mg/dL (ref 8.4–10.5)
Chloride: 104 meq/L (ref 96–112)
Creatinine, Ser: 0.81 mg/dL (ref 0.40–1.20)
GFR: 99.26 mL/min (ref >60.00)
Glucose, Bld: 83 mg/dL (ref 70–99)
Potassium: 3.9 meq/L (ref 3.5–5.1)
Sodium: 138 meq/L (ref 135–145)
Total Bilirubin: 0.6 mg/dL (ref 0.2–1.2)
Total Protein: 6.8 g/dL (ref 6.0–8.3)

## 2024-04-26 LAB — CBC
HCT: 42.8 % (ref 36.0–46.0)
Hemoglobin: 14.2 g/dL (ref 12.0–15.0)
MCHC: 33.1 g/dL (ref 30.0–36.0)
MCV: 89.5 fl (ref 78.0–100.0)
Platelets: 247 10*3/uL (ref 150.0–400.0)
RBC: 4.78 Mil/uL (ref 3.87–5.11)
RDW: 13.3 % (ref 11.5–15.5)
WBC: 5.5 10*3/uL (ref 4.0–10.5)

## 2024-04-26 LAB — VITAMIN D 25 HYDROXY (VIT D DEFICIENCY, FRACTURES): VITD: 10.14 ng/mL — ABNORMAL LOW (ref 30.00–100.00)

## 2024-04-26 LAB — TSH: TSH: 2.9 u[IU]/mL (ref 0.35–5.50)

## 2024-04-26 LAB — VITAMIN B12: Vitamin B-12: 245 pg/mL (ref 211–911)

## 2024-04-26 NOTE — Patient Instructions (Addendum)
 Stop by the lab prior to leaving today. I will notify you of your results once received.   Start vitamin b 12 once daily.   Follow up in 4 weeks for physical.   It was a pleasure meeting you!

## 2024-04-26 NOTE — Assessment & Plan Note (Signed)
 EMR reviewed briefly.

## 2024-04-26 NOTE — Progress Notes (Signed)
 New Patient Office Visit  Subjective    Patient ID: Tracie Collier, female    DOB: [birth year]  Age: 28 y.o. MRN: [id number]  CC:  Chief Complaint  Patient presents with   Transitions Of Care    From Margarie Shay, NP.    Numbness    Patient has a hx of numbness in her arm/hand; did see Margarie Shay for this and labs were done. Patient states its is starting to come back recently.     HPI Tracie Collier is a 28 y.o. female presents to establish care.  Last PCP/physical/labs- Margarie Shay, NP, 2023  Numbness- symptom onset was last year. She started with daily fatigue. Location-arms and hand. Intermittent just started coming back 6 months ago. Had labs done in 2023 which showed that she has a vitamin b 12 deficiency. She started 500 mcg once daily and then increased to 1000 mcg once daily. She was diagnosed with preeclampsia with her pregnancy and wasn't sure if that could be related. She does not sleep on her arm. She denies any blurred vision, shortness of breath, unilateral weakness, chest pain or difficulty breathing.  Outpatient Encounter Medications as of 04/26/2024  Medication Sig   triamcinolone  cream (KENALOG ) 0.1 % Apply 1 application. topically 2 (two) times daily.   No facility-administered encounter medications on file as of 04/26/2024.    Past Medical History:  Diagnosis Date   Eczema    History of pre-eclampsia     Past Surgical History:  Procedure Laterality Date   CESAREAN SECTION     DENTAL EXAMINATION UNDER ANESTHESIA     for deep clean   WISDOM TOOTH EXTRACTION      Family History  Problem Relation Age of Onset   Iron deficiency Mother    Other Mother        low potassium issues   Diabetes Father    Eczema Brother    Breast cancer Paternal Grandmother     Social History   Socioeconomic History   Marital status: Single    Spouse name: Not on file   Number of children: 1   Years of education: Not on file   Highest education level: Bachelor's  degree (e.g., BA, AB, BS)  Occupational History   Not on file  Tobacco Use   Smoking status: Never   Smokeless tobacco: Never  Substance and Sexual Activity   Alcohol use: Yes    Comment: twice a month. with approx 2 drinks   Drug use: No   Sexual activity: Not on file  Other Topics Concern   Not on file  Social History Narrative   A homemaker      Criminal justice degree      Hobbies: party planning, Crafty   Social Drivers of Health   Financial Resource Strain: Not on file  Food Insecurity: Not on file  Transportation Needs: Not on file  Physical Activity: Not on file  Stress: Not on file  Social Connections: Not on file  Intimate Partner Violence: Not on file    Review of Systems  Constitutional:  Negative for chills and fever.  Respiratory:  Negative for shortness of breath.   Cardiovascular:  Negative for chest pain.  Gastrointestinal:  Negative for abdominal pain, constipation, diarrhea, heartburn, nausea and vomiting.  Genitourinary:  Negative for dysuria, frequency and urgency.  Neurological:  Positive for tingling. Negative for dizziness and headaches.  Endo/Heme/Allergies:  Negative for polydipsia.  Psychiatric/Behavioral:  Negative for depression and suicidal ideas. The patient  is not nervous/anxious.         Objective    BP 120/78 (BP Location: Left Arm, Patient Position: Sitting, Cuff Size: Normal)   Pulse 95   Temp 98.3 F (36.8 C) (Oral)   Ht 5' 1.5" (1.562 m)   Wt 154 lb (69.9 kg)   SpO2 97%   BMI 28.63 kg/m   Physical Exam Vitals and nursing note reviewed.  Constitutional:      Appearance: Normal appearance.  Cardiovascular:     Rate and Rhythm: Normal rate and regular rhythm.     Pulses: Normal pulses.     Heart sounds: Normal heart sounds.  Pulmonary:     Effort: Pulmonary effort is normal.     Breath sounds: Normal breath sounds.  Neurological:     Mental Status: She is alert and oriented to person, place, and time.     Cranial  Nerves: Cranial nerves 2-12 are intact.     Sensory: Sensation is intact.     Motor: Motor function is intact.     Coordination: Coordination is intact.     Gait: Gait is intact.  Psychiatric:        Mood and Affect: Mood normal.        Behavior: Behavior normal.        Thought Content: Thought content normal.        Judgment: Judgment normal.         Assessment & Plan:  Numbness and tingling Assessment & Plan: Unclear etiology.  Neuro exam stable today. Good grips and strength bilaterally.   Differentials include vitamin deficiency, thyroid  disorder.  Labs pending to rule out cause.   F/u in 4 weeks for physical and labs.  Orders: -     Vitamin B12 -     VITAMIN D  25 Hydroxy (Vit-D Deficiency, Fractures) -     Hemoglobin A1c -     Lipid panel -     CBC -     Comprehensive metabolic panel with GFR -     TSH  Establishing care with new doctor, encounter for Assessment & Plan: EMR reviewed briefly.    Need for hepatitis C screening test -     Hepatitis C antibody     Return in about 4 weeks (around 05/24/2024) for  physical and pap.   Jolanda Nation, NP

## 2024-04-26 NOTE — Assessment & Plan Note (Signed)
 Unclear etiology.  Neuro exam stable today. Good grips and strength bilaterally.   Differentials include vitamin deficiency, thyroid disorder.  Labs pending to rule out cause.   F/u in 4 weeks for physical and labs.

## 2024-04-27 ENCOUNTER — Ambulatory Visit: Payer: Self-pay | Admitting: General Practice

## 2024-04-27 DIAGNOSIS — E559 Vitamin D deficiency, unspecified: Secondary | ICD-10-CM

## 2024-04-27 LAB — HEPATITIS C ANTIBODY: Hepatitis C Ab: NONREACTIVE

## 2024-05-08 MED ORDER — VITAMIN D (ERGOCALCIFEROL) 1.25 MG (50000 UNIT) PO CAPS: 50000.0000 [IU] | ORAL_CAPSULE | ORAL | 0 refills | Status: DC

## 2024-05-29 ENCOUNTER — Encounter: Payer: Self-pay | Admitting: General Practice

## 2024-05-29 ENCOUNTER — Ambulatory Visit (INDEPENDENT_AMBULATORY_CARE_PROVIDER_SITE_OTHER): Admitting: General Practice

## 2024-05-29 VITALS — BP 110/80 | HR 74 | Temp 98.2°F | Ht 61.5 in | Wt 157.0 lb

## 2024-05-29 DIAGNOSIS — R202 Paresthesia of skin: Secondary | ICD-10-CM

## 2024-05-29 DIAGNOSIS — Z0000 Encounter for general adult medical examination without abnormal findings: Secondary | ICD-10-CM | POA: Diagnosis not present

## 2024-05-29 DIAGNOSIS — R200 Anesthesia of skin: Secondary | ICD-10-CM | POA: Diagnosis not present

## 2024-05-29 DIAGNOSIS — E663 Overweight: Secondary | ICD-10-CM | POA: Diagnosis not present

## 2024-05-29 DIAGNOSIS — Z124 Encounter for screening for malignant neoplasm of cervix: Secondary | ICD-10-CM

## 2024-05-29 DIAGNOSIS — E559 Vitamin D deficiency, unspecified: Secondary | ICD-10-CM | POA: Insufficient documentation

## 2024-05-29 NOTE — Assessment & Plan Note (Signed)
 Improved.  She was found to have Vitamin D  deficiency.  Has been taking Vitamin d  50,000 units once weekly. Symptoms have improved tremendously.  Will recheck labs at next visit.

## 2024-05-29 NOTE — Assessment & Plan Note (Signed)
 Continue Vitamin D  50000 units once weekly.  Will recheck labs at next visit.

## 2024-05-29 NOTE — Progress Notes (Signed)
 Established Patient Office Visit  Subjective   Patient ID: Tracie Collier, female    DOB: Oct 15, 1996  Age: 28 y.o. MRN: [id number]  Chief Complaint  Patient presents with   Annual Exam    No pap today; started cycle this am    HPI  Tracie Collier is a 28 year old female with past medical history of eczema, fatigue presents today for complete physical and follow up of chronic conditions.  Immunizations: -Tetanus: Completed in 2021.  Diet: Fair diet. Has been including a lot of vegetables.  Exercise: No regular exercise.   Eye exam: Completed two years ago. Dental exam: Completes annually.  Pap Smear: Completed in 2021; due; declines today due to being on menstrual cycle.   Declines HPV vaccines.  Patient Active Problem List   Diagnosis Date Noted   Encounter for screening and preventative care 05/29/2024   Vitamin D  deficiency 05/29/2024   Numbness and tingling 04/26/2024   Nipple discharge 05/28/2023   Eczema 04/14/2022   Overweight 04/14/2022   Bilateral knee pain 04/23/2016   Past Medical History:  Diagnosis Date   Eczema    History of pre-eclampsia    Past Surgical History:  Procedure Laterality Date   CESAREAN SECTION     DENTAL EXAMINATION UNDER ANESTHESIA     for deep clean   WISDOM TOOTH EXTRACTION     Allergies  Allergen Reactions   Amoxicillin Hives   Penicillins Hives         05/29/2024    9:58 AM 04/26/2024   10:27 AM 05/28/2023   10:46 AM  Depression screen PHQ 2/9  Decreased Interest 0 0 1  Down, Depressed, Hopeless 0 0 0  PHQ - 2 Score 0 0 1  Altered sleeping 1 1 1   Tired, decreased energy 0 0 1  Change in appetite 0 0 0  Feeling bad or failure about yourself  0 0 0  Trouble concentrating 0 1 0  Moving slowly or fidgety/restless 0 0 0  Suicidal thoughts 0 0 0  PHQ-9 Score 1 2 3   Difficult doing work/chores Not difficult at all Not difficult at all Not difficult at all       05/29/2024    9:59 AM 04/26/2024   10:27 AM  05/28/2023   10:47 AM 04/14/2022   11:09 AM  GAD 7 : Generalized Anxiety Score  Nervous, Anxious, on Edge 1 2 2 2   Control/stop worrying 1 2 1 2   Worry too much - different things 1 2 1 2   Trouble relaxing 0 1 1 1   Restless 0 0 0 1  Easily annoyed or irritable 0 1 1 2   Afraid - awful might happen 0 1 1 0  Total GAD 7 Score 3 9 7 10   Anxiety Difficulty Not difficult at all Not difficult at all Not difficult at all Somewhat difficult      Review of Systems  Constitutional:  Negative for chills, fever, malaise/fatigue and weight loss.  HENT:  Negative for congestion, ear discharge, ear pain, hearing loss, nosebleeds, sinus pain, sore throat and tinnitus.   Eyes:  Negative for blurred vision, double vision, pain, discharge and redness.  Respiratory:  Negative for cough, shortness of breath, wheezing and stridor.   Cardiovascular:  Negative for chest pain, palpitations and leg swelling.  Gastrointestinal:  Negative for abdominal pain, constipation, diarrhea, heartburn, nausea and vomiting.  Genitourinary:  Negative for dysuria, frequency and urgency.  Musculoskeletal:  Negative for myalgias.  Skin:  Negative for  rash.  Neurological:  Negative for dizziness, tingling, seizures, weakness and headaches.  Psychiatric/Behavioral:  Negative for depression, substance abuse and suicidal ideas. The patient is not nervous/anxious.       Objective:     BP 110/80   Pulse 74   Temp 98.2 F (36.8 C) (Oral)   Ht 5' 1.5 (1.562 m)   Wt 157 lb (71.2 kg)   LMP 05/29/2024 (Exact Date)   SpO2 98%   BMI 29.18 kg/m  BP Readings from Last 3 Encounters:  05/29/24 110/80  04/26/24 120/78  05/28/23 100/70   Wt Readings from Last 3 Encounters:  05/29/24 157 lb (71.2 kg)  04/26/24 154 lb (69.9 kg)  05/28/23 155 lb 8 oz (70.5 kg)      Physical Exam Vitals and nursing note reviewed.  Constitutional:      Appearance: Normal appearance.  HENT:     Head: Normocephalic and atraumatic.     Right  Ear: Tympanic membrane, ear canal and external ear normal.     Left Ear: Tympanic membrane, ear canal and external ear normal.     Nose: Nose normal.     Mouth/Throat:     Mouth: Mucous membranes are moist.     Pharynx: Oropharynx is clear.   Eyes:     Conjunctiva/sclera: Conjunctivae normal.     Pupils: Pupils are equal, round, and reactive to light.    Cardiovascular:     Rate and Rhythm: Normal rate and regular rhythm.     Pulses: Normal pulses.     Heart sounds: Normal heart sounds.  Pulmonary:     Effort: Pulmonary effort is normal.     Breath sounds: Normal breath sounds.  Abdominal:     General: Abdomen is flat. Bowel sounds are normal.     Palpations: Abdomen is soft.   Musculoskeletal:        General: Normal range of motion.     Cervical back: Normal range of motion.   Skin:    General: Skin is warm and dry.     Capillary Refill: Capillary refill takes less than 2 seconds.   Neurological:     General: No focal deficit present.     Mental Status: She is alert and oriented to person, place, and time. Mental status is at baseline.   Psychiatric:        Mood and Affect: Mood normal.        Behavior: Behavior normal.        Thought Content: Thought content normal.        Judgment: Judgment normal.      No results found for any visits on 05/29/24.     The ASCVD Risk score (Arnett DK, et al., 2019) failed to calculate for the following reasons:   The 2019 ASCVD risk score is only valid for ages 26 to 48    Assessment & Plan:  Encounter for screening and preventative care Assessment & Plan: Immunizations UTD. Pap smear due; declines today  Discussed the importance of a healthy diet and regular exercise in order for weight loss, and to reduce the risk of further co-morbidity.  Exam stable. Labs reviewed.   Follow up in 1 year for repeat physical.    Vitamin D  deficiency Assessment & Plan: Continue Vitamin D  50000 units once weekly.  Will recheck  labs at next visit.  Orders: -     VITAMIN D  25 Hydroxy (Vit-D Deficiency, Fractures); Future  Numbness and tingling Assessment & Plan: Improved.  She was found to have Vitamin D  deficiency.  Has been taking Vitamin d  50,000 units once weekly. Symptoms have improved tremendously.  Will recheck labs at next visit.   Overweight Assessment & Plan: Discussed the importance of healthy diet and exercise to affect sustainable weight loss.      Return in about 5 months (around 10/29/2024) for vitamin d , cholesterol and pap ONLY appt.SABRA Carrol Aurora, NP

## 2024-05-29 NOTE — Assessment & Plan Note (Signed)
 Discussed the importance of healthy diet and exercise to affect sustainable weight loss.

## 2024-05-29 NOTE — Assessment & Plan Note (Signed)
 Immunizations UTD. Pap smear due; declines today  Discussed the importance of a healthy diet and regular exercise in order for weight loss, and to reduce the risk of further co-morbidity.  Exam stable. Labs reviewed.   Follow up in 1 year for repeat physical.

## 2024-05-29 NOTE — Patient Instructions (Addendum)
 Schedule office visit for vitamin d , cholesterol and pap smear for the end of November.   Continue to monitor your diet and increase exercise.   It was a pleasure to see you today!

## 2024-07-29 ENCOUNTER — Other Ambulatory Visit: Payer: Self-pay | Admitting: General Practice

## 2024-07-29 DIAGNOSIS — E559 Vitamin D deficiency, unspecified: Secondary | ICD-10-CM

## 2024-08-01 NOTE — Telephone Encounter (Signed)
 Patient needs lab appt to recheck vitamin d  before rx can be refilled. Please call patient to schedule lab appt.

## 2024-08-02 NOTE — Telephone Encounter (Signed)
I called and left a voicemail for patient.

## 2024-08-07 ENCOUNTER — Other Ambulatory Visit (INDEPENDENT_AMBULATORY_CARE_PROVIDER_SITE_OTHER)

## 2024-08-07 ENCOUNTER — Ambulatory Visit: Payer: Self-pay | Admitting: General Practice

## 2024-08-07 DIAGNOSIS — E559 Vitamin D deficiency, unspecified: Secondary | ICD-10-CM | POA: Diagnosis not present

## 2024-08-07 LAB — VITAMIN D 25 HYDROXY (VIT D DEFICIENCY, FRACTURES): VITD: 29.41 ng/mL — ABNORMAL LOW (ref 30.00–100.00)

## 2024-08-07 MED ORDER — VITAMIN D (ERGOCALCIFEROL) 1.25 MG (50000 UNIT) PO CAPS: 50000.0000 [IU] | ORAL_CAPSULE | ORAL | 0 refills | Status: DC

## 2024-11-01 ENCOUNTER — Ambulatory Visit: Admitting: General Practice

## 2024-11-13 ENCOUNTER — Ambulatory Visit: Payer: Self-pay | Admitting: General Practice

## 2024-11-13 ENCOUNTER — Encounter: Payer: Self-pay | Admitting: General Practice

## 2024-11-13 ENCOUNTER — Ambulatory Visit: Admitting: General Practice

## 2024-11-13 VITALS — BP 120/82 | HR 82 | Temp 97.8°F | Ht 61.5 in | Wt 166.0 lb

## 2024-11-13 DIAGNOSIS — E782 Mixed hyperlipidemia: Secondary | ICD-10-CM

## 2024-11-13 DIAGNOSIS — Z124 Encounter for screening for malignant neoplasm of cervix: Secondary | ICD-10-CM

## 2024-11-13 DIAGNOSIS — E559 Vitamin D deficiency, unspecified: Secondary | ICD-10-CM | POA: Diagnosis not present

## 2024-11-13 LAB — LIPID PANEL
Cholesterol: 197 mg/dL (ref 0–200)
HDL: 53.7 mg/dL (ref >39.00)
LDL Cholesterol: 114 mg/dL — ABNORMAL HIGH (ref 0–99)
NonHDL: 142.86
Total CHOL/HDL Ratio: 4
Triglycerides: 146 mg/dL (ref 0.0–149.0)
VLDL: 29.2 mg/dL (ref 0.0–40.0)

## 2024-11-13 LAB — VITAMIN D 25 HYDROXY (VIT D DEFICIENCY, FRACTURES): VITD: 22.05 ng/mL — ABNORMAL LOW (ref 30.00–100.00)

## 2024-11-13 MED ORDER — VITAMIN D (ERGOCALCIFEROL) 1.25 MG (50000 UNIT) PO CAPS: 50000.0000 [IU] | ORAL_CAPSULE | ORAL | 0 refills | Status: AC

## 2024-11-13 NOTE — Progress Notes (Signed)
 Established Patient Office Visit  Subjective   Patient ID: Tracie Collier, female    DOB: [birth year]  Age: 28 y.o. MRN: [id number]  Chief Complaint  Patient presents with   Follow-up    With pap smear and labs    HPI  Tracie Collier is a 28 year old female with history of Vitamin d  deficiency, eczema, numbness and tingling presents today for an acute visit to complete pap smear, vitamin d  deficiency and HLD.  Discussed the use of AI scribe software for clinical note transcription with the patient, who gave verbal consent to proceed.  History of Present Illness Tracie Collier is a 28 year old female who presents for follow-up on high cholesterol and vitamin D  deficiency.  She is following up on her high cholesterol, initially identified during her last visit in May. She has been implementing dietary changes and is on a health journey with her brother. She confirms fasting before today's lab work. She denies chest pain, shortness of breath, or difficulty breathing. She engages in physical activity by walking for 20 to 30 minutes, four to five times a week.  She has a history of low vitamin D  levels and is currently taking a once-a-week vitamin D  supplement, which is due for re-evaluation today.  She is due for a Pap smear, which she last had four years ago. She has not received the HPV vaccine.     Patient Active Problem List   Diagnosis Date Noted   Encounter for screening and preventative care 05/29/2024   Vitamin D  deficiency 05/29/2024   Numbness and tingling 04/26/2024   Nipple discharge 05/28/2023   Eczema 04/14/2022   Overweight 04/14/2022   Bilateral knee pain 04/23/2016   Past Medical History:  Diagnosis Date   Eczema    History of pre-eclampsia    Past Surgical History:  Procedure Laterality Date   CESAREAN SECTION     DENTAL EXAMINATION UNDER ANESTHESIA     for deep clean   WISDOM TOOTH EXTRACTION     Allergies[1]       11/13/2024   11:25 AM  05/29/2024    9:58 AM 04/26/2024   10:27 AM  Depression screen PHQ 2/9  Decreased Interest 0 0 0  Down, Depressed, Hopeless 0 0 0  PHQ - 2 Score 0 0 0  Altered sleeping 1 1 1   Tired, decreased energy 0 0 0  Change in appetite 0 0 0  Feeling bad or failure about yourself  0 0 0  Trouble concentrating 0 0 1  Moving slowly or fidgety/restless 0 0 0  Suicidal thoughts 0 0 0  PHQ-9 Score 1 1  2    Difficult doing work/chores Not difficult at all Not difficult at all Not difficult at all     Data saved with a previous flowsheet row definition       11/13/2024   11:25 AM 05/29/2024    9:59 AM 04/26/2024   10:27 AM 05/28/2023   10:47 AM  GAD 7 : Generalized Anxiety Score  Nervous, Anxious, on Edge 1 1 2 2   Control/stop worrying 1 1 2 1   Worry too much - different things 1 1 2 1   Trouble relaxing 1 0 1 1  Restless 1 0 0 0  Easily annoyed or irritable 1 0 1 1  Afraid - awful might happen 0 0 1 1  Total GAD 7 Score 6 3 9 7   Anxiety Difficulty Not difficult at all Not difficult at all Not difficult  at all Not difficult at all      Review of Systems  Constitutional:  Negative for chills and fever.  Respiratory:  Negative for shortness of breath.   Cardiovascular:  Negative for chest pain.  Gastrointestinal:  Negative for abdominal pain, constipation, diarrhea, heartburn, nausea and vomiting.  Genitourinary:  Negative for dysuria, frequency and urgency.  Neurological:  Negative for dizziness and headaches.  Endo/Heme/Allergies:  Negative for polydipsia.  Psychiatric/Behavioral:  Negative for depression and suicidal ideas. The patient is not nervous/anxious.       Objective:     BP 120/82   Pulse 82   Temp 97.8 F (36.6 C) (Temporal)   Ht 5' 1.5 (1.562 m)   Wt 166 lb (75.3 kg)   SpO2 98%   BMI 30.86 kg/m  BP Readings from Last 3 Encounters:  11/13/24 120/82  05/29/24 110/80  04/26/24 120/78   Wt Readings from Last 3 Encounters:  11/13/24 166 lb (75.3 kg)  05/29/24 157  lb (71.2 kg)  04/26/24 154 lb (69.9 kg)      Physical Exam Vitals and nursing note reviewed.  Constitutional:      Appearance: Normal appearance.  Cardiovascular:     Rate and Rhythm: Normal rate and regular rhythm.     Pulses: Normal pulses.     Heart sounds: Normal heart sounds.  Pulmonary:     Effort: Pulmonary effort is normal.     Breath sounds: Normal breath sounds.  Neurological:     Mental Status: She is alert and oriented to person, place, and time.  Psychiatric:        Mood and Affect: Mood normal.        Behavior: Behavior normal.        Thought Content: Thought content normal.        Judgment: Judgment normal.      No results found for any visits on 11/13/24.     The ASCVD Risk score (Arnett DK, et al., 2019) failed to calculate for the following reasons:   The 2019 ASCVD risk score is only valid for ages 24 to 46   * - Cholesterol units were assumed    Assessment & Plan:  Vitamin D  deficiency -     VITAMIN D  25 Hydroxy (Vit-D Deficiency, Fractures)  Screening for cervical cancer -     Ambulatory referral to Gynecology  Mixed hyperlipidemia -     Lipid panel    Assessment and Plan Assessment & Plan  Screening for cervical cancer Pap smear unsuccessful x 2.  Referral to gynecology for further evaluation and Pap smear. Discussed HPV vaccination for cervical cancer prevention. - Referred to gynecology for Pap smear. - Provided gynecologist's contact information for scheduling. - Discussed HPV vaccination for cervical cancer prevention.- declines today.  Mixed hyperlipidemia Previous high cholesterol levels. Dietary changes and exercise implemented. Fasting labs ordered to assess current cholesterol levels. - Ordered fasting lipid panel to assess current cholesterol levels.  Vitamin D  deficiency Managed with weekly supplementation. Plan to check vitamin D  levels to assess response to treatment. - Ordered vitamin D  level to assess response to  supplementation.   No follow-ups on file.    Carrol Aurora, NP    [1]  Allergies Allergen Reactions   Amoxicillin Hives   Penicillins Hives

## 2024-11-13 NOTE — Patient Instructions (Addendum)
 Stop by the lab prior to leaving today. I will notify you of your results once received.    You will either be contacted via phone regarding your referral to gynecology, or you may receive a letter on your MyChart portal from our referral team with instructions for scheduling an appointment. Please let us  know if you have not been contacted by anyone within two weeks.   Continue monitoring diet and exercise.  Follow up in 6 months for physical.   It was a pleasure to see you today!
